# Patient Record
Sex: Female | Born: 1995 | Race: White | Hispanic: No | Marital: Single | Smoking: Never smoker
Health system: Southern US, Community
[De-identification: ages and names within clinical notes are randomized; demographics above are authoritative.]

---

## 2016-09-13 ENCOUNTER — Encounter: Payer: Self-pay | Admitting: Emergency Medicine

## 2016-09-13 ENCOUNTER — Emergency Department: Payer: Self-pay

## 2016-09-13 ENCOUNTER — Emergency Department
Admission: EM | Admit: 2016-09-13 | Discharge: 2016-09-13 | Disposition: A | Discharge status: Home / Self Care | Payer: Self-pay | Attending: Emergency Medicine | Admitting: Emergency Medicine

## 2016-09-13 DIAGNOSIS — R519 Headache, unspecified: Secondary | ICD-10-CM

## 2016-09-13 DIAGNOSIS — R51 Headache: Secondary | ICD-10-CM | POA: Insufficient documentation

## 2016-09-13 LAB — POCT PREGNANCY, URINE: Preg Test, Ur: NEGATIVE

## 2016-09-13 MED ORDER — KETOROLAC TROMETHAMINE 10 MG PO TABS
10.0000 mg | ORAL_TABLET | Freq: Once | ORAL | Status: AC
  Administered 2016-09-13: 10 mg via ORAL
  Filled 2016-09-13: qty 1

## 2016-09-13 NOTE — ED Triage Notes (Signed)
Pt ambulatory to triage c/o stiff neck (8/10 pain in front), HA since yesterday  Am, vomit approx 1 hour x 2, and pt can't sleep d/t neck pain.  Pt unable to swallow pills and denies med hx, pt able to move head up and down.

## 2016-09-13 NOTE — ED Notes (Signed)
Pt ambulatory to the restroom with a steady gait.

## 2016-09-13 NOTE — ED Provider Notes (Signed)
[facility] Emergency Department Provider Note   First MD Initiated Contact with Patient 09/13/16 [phone]     (approximate)  I have reviewed the triage vital signs and the nursing notes.   HISTORY  Chief Complaint Torticollis; Headache; and Emesis    HPI Jessica Wyatt is a 20 y.o. female presents with one-day history of headache and posterior anterior neck discomfort. Patient states her current pain score is 7 out of 10. Patient denies any weakness no numbness no gait instability. Patient denies any visual changes. Patient denies any fever afebrile on presentation temperature 97.4. Patient denies taking anything for relief before presentation to the ER.   History reviewed. No pertinent past medical history.  There are no active problems to display for this patient.   Past surgical history None  Prior to Admission medications   Not on File    Allergies Patient has no known allergies.  History reviewed. No pertinent family history.  Social History Social History  Substance Use Topics  . Smoking status: Never Smoker  . Smokeless tobacco: Never Used  . Alcohol use No    Review of Systems Constitutional: No fever/chills Eyes: No visual changes. ENT: No sore throat. Cardiovascular: Denies chest pain. Respiratory: Denies shortness of breath. Gastrointestinal: No abdominal pain.  No nausea, no vomiting.  No diarrhea.  No constipation. Genitourinary: Negative for dysuria. Musculoskeletal: Negative for back pain. Skin: Negative for rash. Neurological: Positive for headaches. Negative for focal weakness or numbness  10-point ROS otherwise negative.  ____________________________________________   PHYSICAL EXAM:  VITAL SIGNS: ED Triage Vitals  Enc Vitals Group     BP 09/13/16 0442 (!) 149/99     Pulse Rate 09/13/16 0442 91     Resp 09/13/16 0442 18     Temp 09/13/16 0442 97.4 F (36.3 C)     Temp Source 09/13/16 0442 Oral     SpO2  09/13/16 0442 99 %     Weight 09/13/16 0442 283 lb (128.4 kg)     Height 09/13/16 0442 5\' 6"  (1.676 m)     Head Circumference --      Peak Flow --      Pain Score 09/13/16 0443 8     Pain Loc --      Pain Edu? --      Excl. in GC? --     Constitutional: Alert and oriented. Well appearing and in no acute distress. Eyes: Conjunctivae are normal. PERRL. EOMI. Head: Atraumatic. Mouth/Throat: Mucous membranes are moist.  Oropharynx non-erythematous. Neck: No stridor.  No meningeal signs.  No cervical spine tenderness to palpation. Cardiovascular: Normal rate, regular rhythm. Good peripheral circulation. Grossly normal heart sounds. Respiratory: Normal respiratory effort.  No retractions. Lungs CTAB. Gastrointestinal: Soft and nontender. No distention.  Musculoskeletal: No lower extremity tenderness nor edema. No gross deformities of extremities. Neurologic:  Normal speech and language. No gross focal neurologic deficits are appreciated.  Skin:  Skin is warm, dry and intact. No rash noted. Psychiatric: Mood and affect are normal. Speech and behavior are normal.  ____________________________________________   LABS (all labs ordered are listed, but only abnormal results are displayed)  Labs Reviewed  POCT PREGNANCY, URINE   ________________________________ RADIOLOGY I, Jessica Wyatt, personally viewed and evaluated these images (plain radiographs) as part of my medical decision making, as well as reviewing the written report by the radiologist.  Ct Head Wo Contrast  Result Date: 09/13/2016 CLINICAL DATA:  Headache yesterday.  Vomiting.  Neck pain.  EXAM: CT HEAD WITHOUT CONTRAST TECHNIQUE: Contiguous axial images were obtained from the base of the skull through the vertex without intravenous contrast. COMPARISON:  None. FINDINGS: Brain: No evidence of acute infarction, hemorrhage, hydrocephalus, extra-axial collection or mass lesion/mass effect. Vascular: No hyperdense vessel or  unexpected calcification. Skull: Normal. Negative for fracture or focal lesion. Sinuses/Orbits: No acute finding. Other: None. IMPRESSION: No acute intracranial abnormalities. Electronically Signed   By: Burman NievesWilliam  Stevens M.D.   On: 09/13/2016 06:42    ___________________  Procedures     INITIAL IMPRESSION / ASSESSMENT AND PLAN / ED COURSE  Pertinent labs & imaging results that were available during my care of the patient were reviewed by me and considered in my medical decision making (see chart for details).  Patient given Toradol 10 mg tablet and emergency department with complete resolution of headache and neck pain   Clinical Course     ____________________________________________  FINAL CLINICAL IMPRESSION(S) / ED DIAGNOSES  Final diagnoses:  Acute nonintractable headache, unspecified headache type     MEDICATIONS GIVEN DURING THIS VISIT:  Medications  ketorolac (TORADOL) tablet 10 mg (10 mg Oral Given 09/13/16 0600)     NEW OUTPATIENT MEDICATIONS STARTED DURING THIS VISIT:  There are no discharge medications for this patient.   There are no discharge medications for this patient.   There are no discharge medications for this patient.    Note:  This document was prepared using Dragon voice recognition software and may include unintentional dictation errors.    Darci Currentandolph N Payden Bonus, MD 09/13/16 [phone]

## 2016-09-13 NOTE — ED Notes (Signed)
Dr. Brown at the bedside for pt evaluation 

## 2017-03-15 IMAGING — CT CT HEAD W/O CM
3 series · 16 of 46 positions shown, 19 images · non-contrast
Comparison: None.

CLINICAL DATA: Headache yesterday.  Vomiting.  Neck pain.

EXAM:
CT HEAD WITHOUT CONTRAST
TECHNIQUE: Contiguous axial images were obtained from the base of the skull
through the vertex without intravenous contrast.

[Series 3: head wo · axial · 0.42mm/px · z∈[-77,+43]mm · 10 of 29 slices shown, 13 images]
[im 3/29  brain]
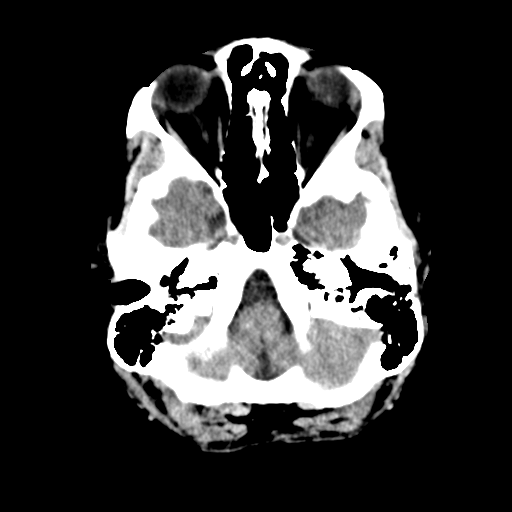
[im 3/29  bone]
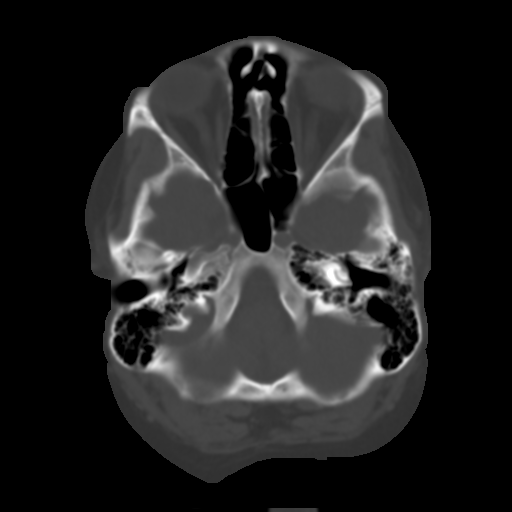
[im 6/29  brain]
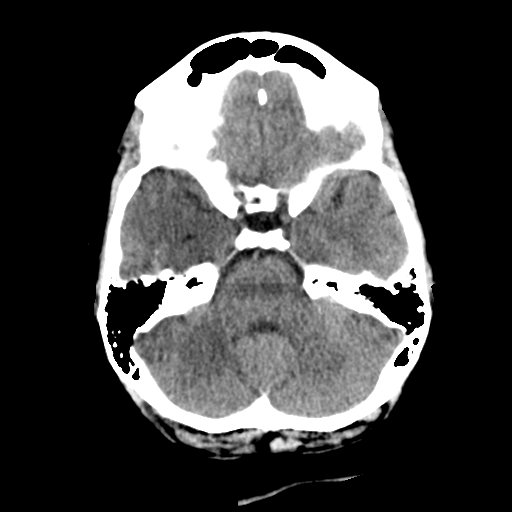
[im 8/29  brain]
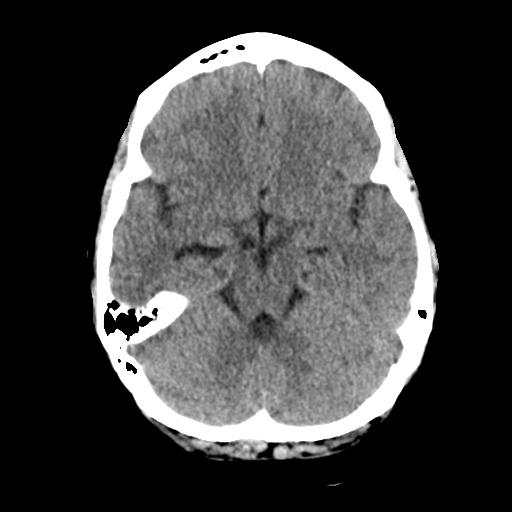
[im 11/29  brain]
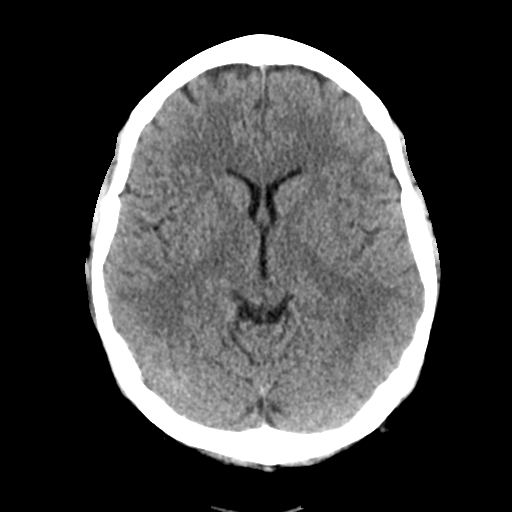
[im 14/29  brain]
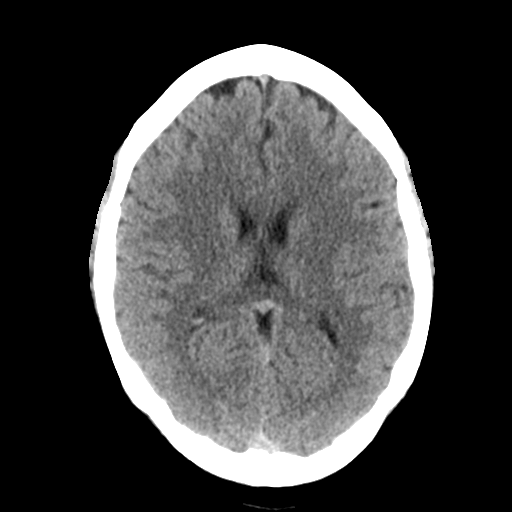
[im 14/29  bone]
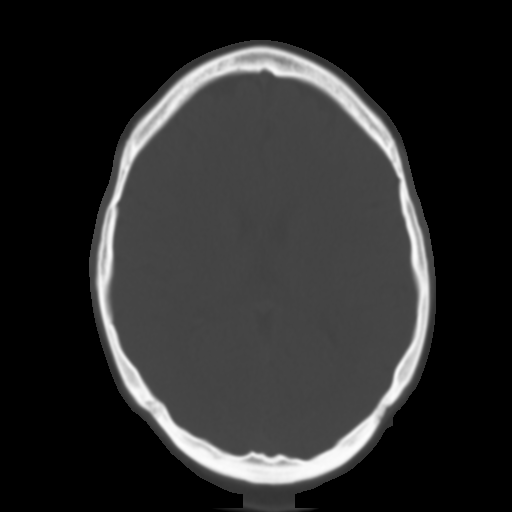
[im 16/29  brain]
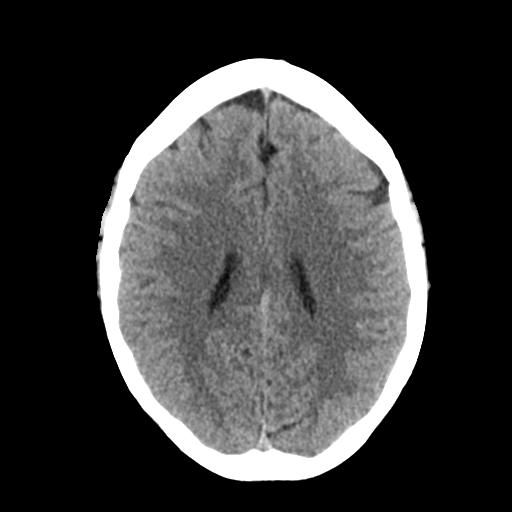
[im 19/29  brain]
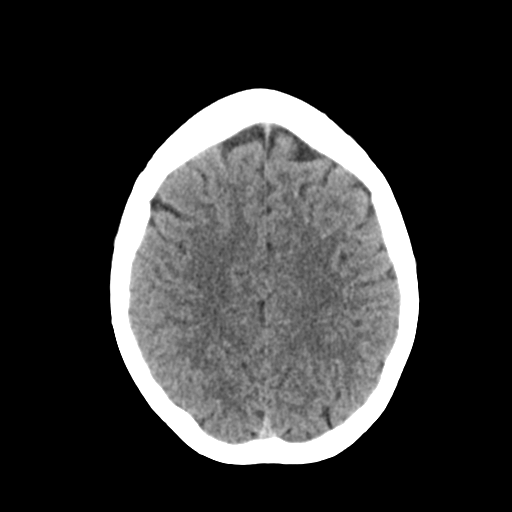
[im 22/29  brain]
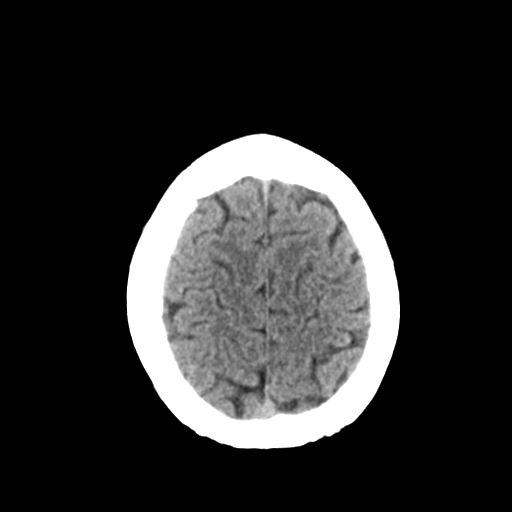
[im 24/29  brain]
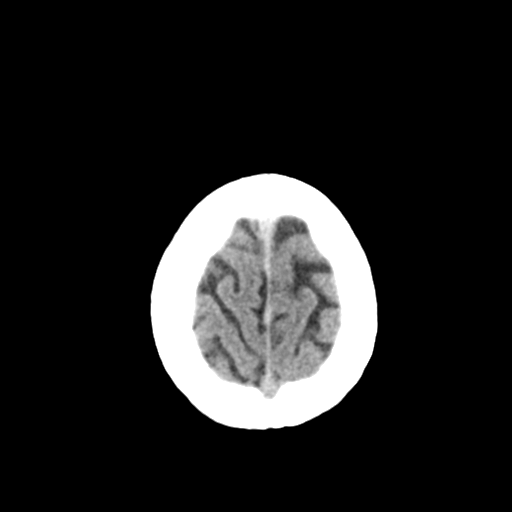
[im 24/29  bone]
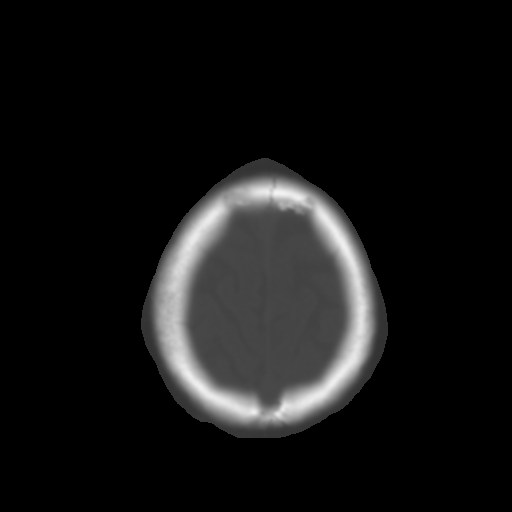
[im 27/29  brain]
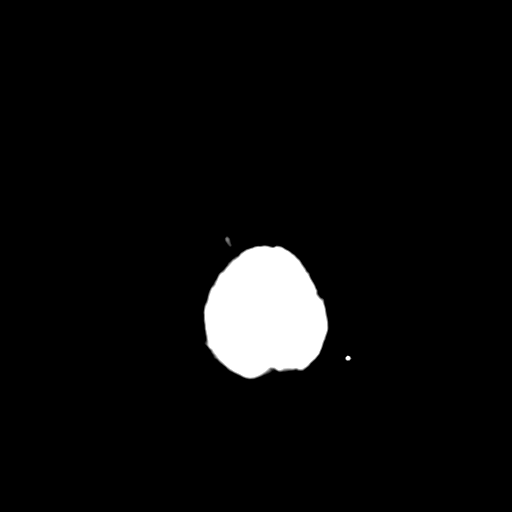

[Series 4: coronal soft tissue · coronal · 0.29mm/px · 3 of 60 slices shown]
[im 20/60  brain]
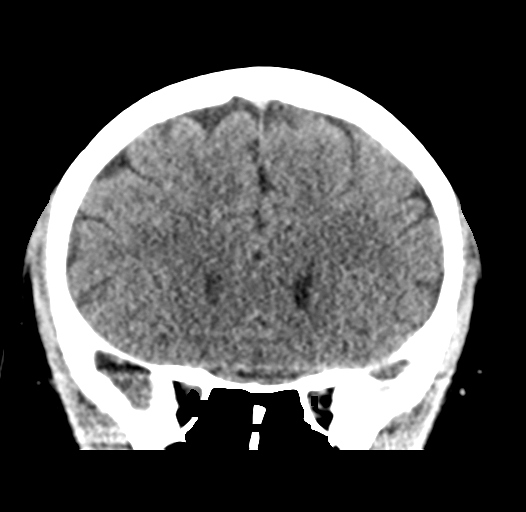
[im 27/60  brain]
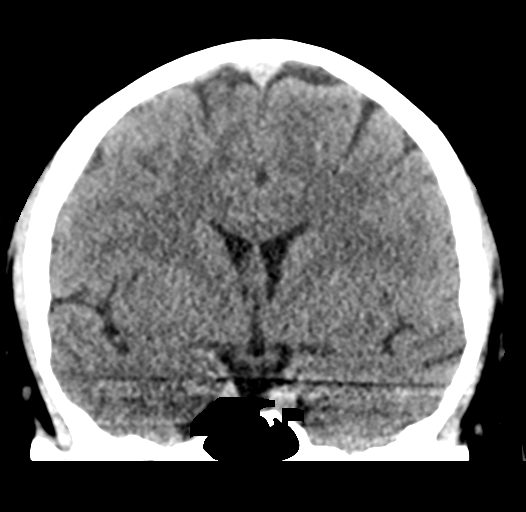
[im 33/60  brain]
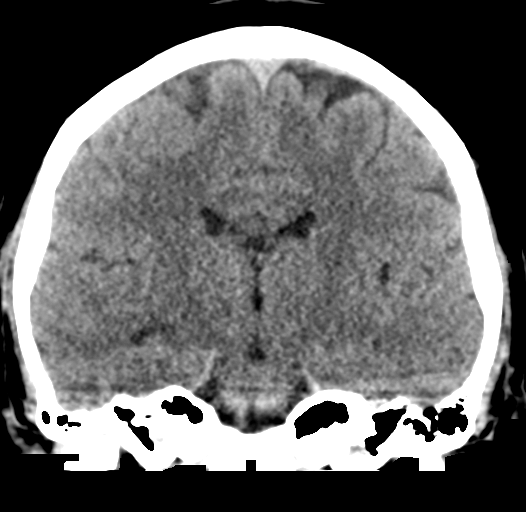

[Series 5: sagittal soft tissue · sagittal · 0.28mm/px · 3 of 50 slices shown]
[im 17/50  brain]
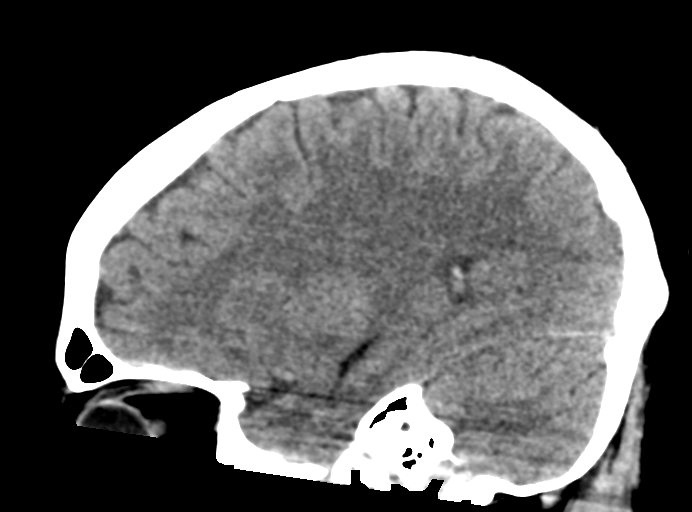
[im 25/50  brain]
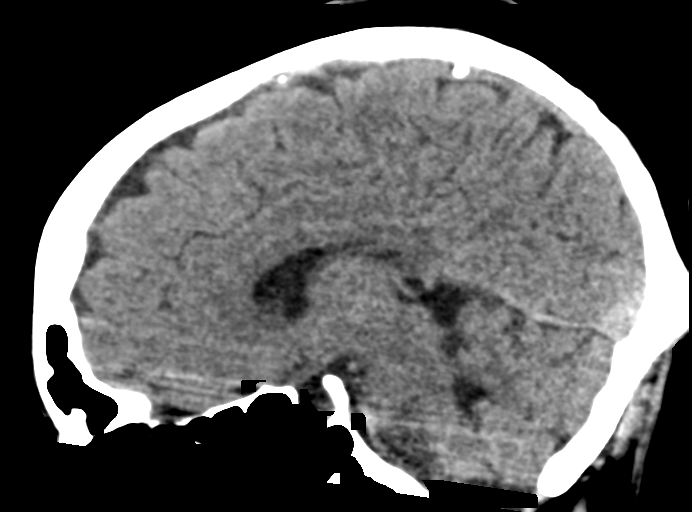
[im 33/50  brain]
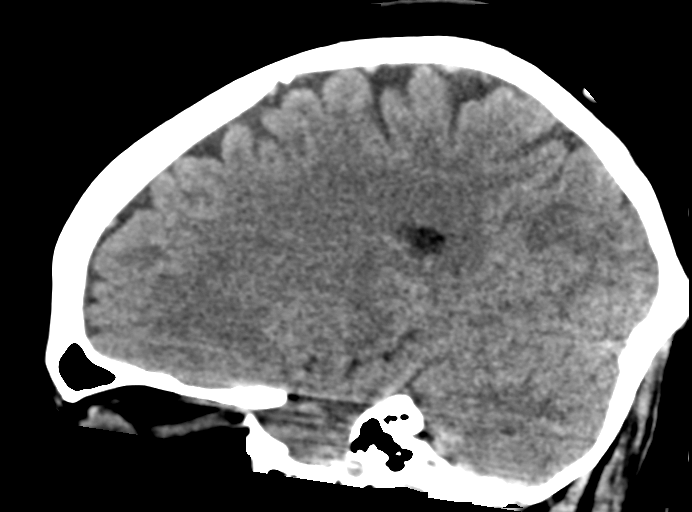

[16 of 46 positions shown; findings below may reference images not displayed]

FINDINGS: Brain: No evidence of acute infarction, hemorrhage, hydrocephalus,
extra-axial collection or mass lesion/mass effect.

Vascular: No hyperdense vessel or unexpected calcification.

Skull: Normal. Negative for fracture or focal lesion.

Sinuses/Orbits: No acute finding.

Other: None.
IMPRESSION: No acute intracranial abnormalities.

## 2017-04-29 ENCOUNTER — Emergency Department
Admission: EM | Admit: 2017-04-29 | Discharge: 2017-04-29 | Disposition: A | Discharge status: Home / Self Care | Payer: Self-pay | Attending: Emergency Medicine | Admitting: Emergency Medicine

## 2017-04-29 ENCOUNTER — Encounter: Payer: Self-pay | Admitting: *Deleted

## 2017-04-29 DIAGNOSIS — M546 Pain in thoracic spine: Secondary | ICD-10-CM | POA: Insufficient documentation

## 2017-04-29 DIAGNOSIS — G8929 Other chronic pain: Secondary | ICD-10-CM | POA: Insufficient documentation

## 2017-04-29 LAB — CBC
HEMATOCRIT: 39.4 % (ref 35.0–47.0)
Hemoglobin: 13.7 g/dL (ref 12.0–16.0)
MCH: 27.6 pg (ref 26.0–34.0)
MCHC: 34.6 g/dL (ref 32.0–36.0)
MCV: 79.6 fL — AB (ref 80.0–100.0)
PLATELETS: 347 10*3/uL (ref 150–440)
RBC: 4.95 MIL/uL (ref 3.80–5.20)
RDW: 13 % (ref 11.5–14.5)
WBC: 8.5 10*3/uL (ref 3.6–11.0)

## 2017-04-29 LAB — COMPREHENSIVE METABOLIC PANEL
ALBUMIN: 4.5 g/dL (ref 3.5–5.0)
ALT: 15 U/L (ref 14–54)
AST: 16 U/L (ref 15–41)
Alkaline Phosphatase: 48 U/L (ref 38–126)
Anion gap: 7 (ref 5–15)
BILIRUBIN TOTAL: 0.4 mg/dL (ref 0.3–1.2)
BUN: 9 mg/dL (ref 6–20)
CHLORIDE: 103 mmol/L (ref 101–111)
CO2: 23 mmol/L (ref 22–32)
CREATININE: 0.74 mg/dL (ref 0.44–1.00)
Calcium: 8.8 mg/dL — ABNORMAL LOW (ref 8.9–10.3)
GFR calc Af Amer: >60 mL/min (ref >60)
GLUCOSE: 98 mg/dL (ref 65–99)
POTASSIUM: 3.5 mmol/L (ref 3.5–5.1)
Sodium: 133 mmol/L — ABNORMAL LOW (ref 135–145)
Total Protein: 7.7 g/dL (ref 6.5–8.1)

## 2017-04-29 LAB — URINALYSIS, COMPLETE (UACMP) WITH MICROSCOPIC
BACTERIA UA: NONE SEEN
BILIRUBIN URINE: NEGATIVE
Glucose, UA: NEGATIVE mg/dL
Ketones, ur: NEGATIVE mg/dL
Nitrite: NEGATIVE
PH: 5 (ref 5.0–8.0)
Protein, ur: NEGATIVE mg/dL
SPECIFIC GRAVITY, URINE: 1.021 (ref 1.005–1.030)

## 2017-04-29 LAB — LIPASE, BLOOD: Lipase: 29 U/L (ref 11–51)

## 2017-04-29 MED ORDER — CYCLOBENZAPRINE HCL 10 MG PO TABS: 10.0000 mg | ORAL_TABLET | Freq: Three times a day (TID) | ORAL | 0 refills | Status: AC | PRN

## 2017-04-29 MED ORDER — LIDOCAINE 5 % EX PTCH
1.0000 | MEDICATED_PATCH | Freq: Once | CUTANEOUS | Status: DC
  Administered 2017-04-29: 1 via TRANSDERMAL

## 2017-04-29 MED ORDER — LIDOCAINE 5 % EX PTCH
MEDICATED_PATCH | CUTANEOUS | Status: AC
  Administered 2017-04-29: 1 via TRANSDERMAL
  Filled 2017-04-29: qty 1

## 2017-04-29 NOTE — ED Triage Notes (Signed)
States right back/flank pain that radiates to her RUQ, states nausea, denies any urinary symptoms, awake and alert in no acute distress

## 2017-04-29 NOTE — Discharge Instructions (Signed)
Please take your pain medication as prescribed and follow up with primary care as needed for a recheck.  Return to the ED for any concerns.  It was a pleasure to take care of you today, and thank you for coming to our emergency department.  If you have any questions or concerns before leaving please ask the nurse to grab me and I'm more than happy to go through your aftercare instructions again.  If you were prescribed any opioid pain medication today such as Norco, Vicodin, Percocet, morphine, hydrocodone, or oxycodone please make sure you do not drive when you are taking this medication as it can alter your ability to drive safely.  If you have any concerns once you are home that you are not improving or are in fact getting worse before you can make it to your follow-up appointment, please do not hesitate to call [phone] and come back for further evaluation.  Merrily BrittleNeil Fenix Rorke MD  Results for orders placed or performed during the hospital encounter of 04/29/17  Lipase, blood  Result Value Ref Range   Lipase 29 11 - 51 U/L  Comprehensive metabolic panel  Result Value Ref Range   Sodium 133 (L) 135 - 145 mmol/L   Potassium 3.5 3.5 - 5.1 mmol/L   Chloride 103 101 - 111 mmol/L   CO2 23 22 - 32 mmol/L   Glucose, Bld 98 65 - 99 mg/dL   BUN 9 6 - 20 mg/dL   Creatinine, Ser 1.610.74 0.44 - 1.00 mg/dL   Calcium 8.8 (L) 8.9 - 10.3 mg/dL   Total Protein 7.7 6.5 - 8.1 g/dL   Albumin 4.5 3.5 - 5.0 g/dL   AST 16 15 - 41 U/L   ALT 15 14 - 54 U/L   Alkaline Phosphatase 48 38 - 126 U/L   Total Bilirubin 0.4 0.3 - 1.2 mg/dL   GFR calc non Af Amer >60 >60 mL/min   GFR calc Af Amer >60 >60 mL/min   Anion gap 7 5 - 15  CBC  Result Value Ref Range   WBC 8.5 3.6 - 11.0 K/uL   RBC 4.95 3.80 - 5.20 MIL/uL   Hemoglobin 13.7 12.0 - 16.0 g/dL   HCT 09.639.4 04.535.0 - 40.947.0 %   MCV 79.6 (L) 80.0 - 100.0 fL   MCH 27.6 26.0 - 34.0 pg   MCHC 34.6 32.0 - 36.0 g/dL   RDW 81.113.0 91.411.5 - 78.214.5 %   Platelets 347 150 - 440 K/uL    Urinalysis, Complete w Microscopic  Result Value Ref Range   Color, Urine YELLOW (A) YELLOW   APPearance HAZY (A) CLEAR   Specific Gravity, Urine 1.021 1.005 - 1.030   pH 5.0 5.0 - 8.0   Glucose, UA NEGATIVE NEGATIVE mg/dL   Hgb urine dipstick MODERATE (A) NEGATIVE   Bilirubin Urine NEGATIVE NEGATIVE   Ketones, ur NEGATIVE NEGATIVE mg/dL   Protein, ur NEGATIVE NEGATIVE mg/dL   Nitrite NEGATIVE NEGATIVE   Leukocytes, UA SMALL (A) NEGATIVE   RBC / HPF 0-5 0 - 5 RBC/hpf   WBC, UA 0-5 0 - 5 WBC/hpf   Bacteria, UA NONE SEEN NONE SEEN   Squamous Epithelial / LPF 6-30 (A) NONE SEEN   Mucous PRESENT

## 2017-04-29 NOTE — ED Notes (Signed)
Pt discharged to home.  Family member driving.  Discharge instructions reviewed.  Verbalized understanding.  No questions or concerns at this time.  Teach back verified.  Pt in NAD.  No items left in ED.   

## 2017-04-29 NOTE — ED Provider Notes (Signed)
[facility] Emergency Department Provider Note  ____________________________________________   First MD Initiated Contact with Patient 04/29/17 1738     (approximate)  I have reviewed the triage vital signs and the nursing notes.   HISTORY  Chief Complaint Abdominal Pain   HPI Jessica Wyatt is a 21 y.o. female who self presents to the emergency department with several days of right upper back pain. Pain is difficult for her to quantify. It is moderate in severity. Nothing makes it better or worse. It is not positional. It is not postprandial. She's had no nausea no vomiting. She's had no dysuria frequency or hesitancy. She's taken no medications and has gotten no relief.The pain seems to radiate from her right upper back forward across her diffuse upper abdomen.   History reviewed. No pertinent past medical history.  There are no active problems to display for this patient.   History reviewed. No pertinent surgical history.  Prior to Admission medications   Medication Sig Start Date End Date Taking? Authorizing Provider  cyclobenzaprine (FLEXERIL) 10 MG tablet Take 1 tablet (10 mg total) by mouth 3 (three) times daily as needed for muscle spasms. 04/29/17   Merrily Brittle, MD    Allergies Patient has no known allergies.  History reviewed. No pertinent family history.  Social History Social History  Substance Use Topics  . Smoking status: Never Smoker  . Smokeless tobacco: Never Used  . Alcohol use No    Review of Systems Constitutional: No fever/chills Eyes: No visual changes. ENT: No sore throat. Cardiovascular: Denies chest pain. Respiratory: Denies shortness of breath. Gastrointestinal: Positive abdominal pain.  No nausea, no vomiting.  No diarrhea.  No constipation. Genitourinary: Negative for dysuria. Musculoskeletal: Positive for back pain. Skin: Negative for rash. Neurological: Negative for headaches, focal weakness or  numbness.   ____________________________________________   PHYSICAL EXAM:  VITAL SIGNS: ED Triage Vitals [04/29/17 1500]  Enc Vitals Group     BP (!) 125/96     Pulse Rate 93     Resp 18     Temp 99 F (37.2 C)     Temp Source Oral     SpO2 100 %     Weight 265 lb (120.2 kg)     Height 5\' 6"  (1.676 m)     Head Circumference      Peak Flow      Pain Score 7     Pain Loc      Pain Edu?      Excl. in GC?     Constitutional: Alert and oriented 4 very well-appearing nontoxic no diaphoresis speaks in full clear sentences Eyes: PERRL EOMI. Head: Atraumatic. Nose: No congestion/rhinnorhea. Mouth/Throat: No trismus Neck: No stridor.   Cardiovascular: Normal rate, regular rhythm. Grossly normal heart sounds.  Good peripheral circulation. Respiratory: Normal respiratory effort.  No retractions. Lungs CTAB and moving good air Gastrointestinal: Soft nondistended nontender no rebound or guarding no peritonitis no McBurney's tenderness negative Rovsing's no costovertebral tenderness negative Murphy's Musculoskeletal: No lower extremity edema  somewhat focal tenderness right paraspinal roughly L3 region Neurologic:  Normal speech and language. No gross focal neurologic deficits are appreciated. Skin:  Skin is warm, dry and intact. No rash noted. Psychiatric: Somewhat flat affect Speech and behavior are normal.    ____________________________________________   DIFFERENTIAL includes but not limited to  Biliary colic, cholecystitis, musculoskeletal back pain, pancreatitis, appendicitis, diverticulitis, pyelonephritis, renal colic ____________________________________________   LABS (all labs ordered are listed, but only abnormal  results are displayed)  Labs Reviewed  COMPREHENSIVE METABOLIC PANEL - Abnormal; Notable for the following:       Result Value   Sodium 133 (*)    Calcium 8.8 (*)    All other components within normal limits  CBC - Abnormal; Notable for the following:     MCV 79.6 (*)    All other components within normal limits  URINALYSIS, COMPLETE (UACMP) WITH MICROSCOPIC - Abnormal; Notable for the following:    Color, Urine YELLOW (*)    APPearance HAZY (*)    Hgb urine dipstick MODERATE (*)    Leukocytes, UA SMALL (*)    Squamous Epithelial / LPF 6-30 (*)    All other components within normal limits  LIPASE, BLOOD    The patient does have blood in her urine but she is currently demonstrating. __________________________________________  EKG   ____________________________________________  RADIOLOGY   ____________________________________________   PROCEDURES  Procedure(s) performed: no  Procedures  Critical Care performed: no  Observation: no ____________________________________________   INITIAL IMPRESSION / ASSESSMENT AND PLAN / ED COURSE  Pertinent labs & imaging results that were available during my care of the patient were reviewed by me and considered in my medical decision making (see chart for details).  The patient arrives very well-appearing with a benign exam. History is challenging to obtain as the patient has difficulty quantifying her pain. She does have focal back pain and this likely represents musculoskeletal. I will treat her symptomatically and reevaluate.  The patient's pain is somewhat improved. Her abdomen remains benign and I do not believe she warrants imaging at this time. I will give her a trial of muscle relaxants and refer her back to primary care.      ____________________________________________   FINAL CLINICAL IMPRESSION(S) / ED DIAGNOSES  Final diagnoses:  Chronic right-sided thoracic back pain      NEW MEDICATIONS STARTED DURING THIS VISIT:  Discharge Medication List as of 04/29/2017  6:56 PM    START taking these medications   Details  cyclobenzaprine (FLEXERIL) 10 MG tablet Take 1 tablet (10 mg total) by mouth 3 (three) times daily as needed for muscle spasms., Starting Wed  04/29/2017, Print         Note:  This document was prepared using Dragon voice recognition software and may include unintentional dictation errors.     Merrily Brittleifenbark, Riata Ikeda, MD 04/29/17 2316
